# Patient Record
Sex: Female | Born: 1960 | Race: White | Hispanic: No | Marital: Married | Smoking: Never smoker
Health system: Southern US, Community
[De-identification: ages and names within clinical notes are randomized; demographics above are authoritative.]

## PROBLEM LIST (undated history)

## (undated) DIAGNOSIS — I319 Disease of pericardium, unspecified: Secondary | ICD-10-CM

## (undated) DIAGNOSIS — G2581 Restless legs syndrome: Secondary | ICD-10-CM

## (undated) DIAGNOSIS — U071 COVID-19: Secondary | ICD-10-CM

---

## 2005-08-24 DIAGNOSIS — M545 Low back pain, unspecified: Secondary | ICD-10-CM | POA: Insufficient documentation

## 2005-10-11 DIAGNOSIS — G5620 Lesion of ulnar nerve, unspecified upper limb: Secondary | ICD-10-CM | POA: Insufficient documentation

## 2005-11-10 DIAGNOSIS — G5600 Carpal tunnel syndrome, unspecified upper limb: Secondary | ICD-10-CM | POA: Insufficient documentation

## 2006-04-11 DIAGNOSIS — M654 Radial styloid tenosynovitis [de Quervain]: Secondary | ICD-10-CM | POA: Insufficient documentation

## 2017-08-12 DIAGNOSIS — N952 Postmenopausal atrophic vaginitis: Secondary | ICD-10-CM | POA: Insufficient documentation

## 2017-12-13 DIAGNOSIS — R030 Elevated blood-pressure reading, without diagnosis of hypertension: Secondary | ICD-10-CM | POA: Insufficient documentation

## 2017-12-13 DIAGNOSIS — I10 Essential (primary) hypertension: Secondary | ICD-10-CM | POA: Insufficient documentation

## 2017-12-13 DIAGNOSIS — M5126 Other intervertebral disc displacement, lumbar region: Secondary | ICD-10-CM | POA: Insufficient documentation

## 2017-12-13 DIAGNOSIS — M539 Dorsopathy, unspecified: Secondary | ICD-10-CM | POA: Insufficient documentation

## 2017-12-13 DIAGNOSIS — K5900 Constipation, unspecified: Secondary | ICD-10-CM | POA: Insufficient documentation

## 2018-12-03 ENCOUNTER — Encounter (HOSPITAL_COMMUNITY): Payer: Self-pay | Admitting: Emergency Medicine

## 2018-12-03 ENCOUNTER — Ambulatory Visit (HOSPITAL_COMMUNITY)
Admission: EM | Admit: 2018-12-03 | Discharge: 2018-12-03 | Disposition: A | Discharge status: Home / Self Care | Payer: 59 | Attending: Family Medicine | Admitting: Family Medicine

## 2018-12-03 DIAGNOSIS — Z87440 Personal history of urinary (tract) infections: Secondary | ICD-10-CM

## 2018-12-03 DIAGNOSIS — R350 Frequency of micturition: Secondary | ICD-10-CM | POA: Insufficient documentation

## 2018-12-03 DIAGNOSIS — S6390XA Sprain of unspecified part of unspecified wrist and hand, initial encounter: Secondary | ICD-10-CM | POA: Insufficient documentation

## 2018-12-03 DIAGNOSIS — N3289 Other specified disorders of bladder: Secondary | ICD-10-CM | POA: Diagnosis present

## 2018-12-03 DIAGNOSIS — M25579 Pain in unspecified ankle and joints of unspecified foot: Secondary | ICD-10-CM | POA: Insufficient documentation

## 2018-12-03 DIAGNOSIS — M461 Sacroiliitis, not elsewhere classified: Secondary | ICD-10-CM | POA: Insufficient documentation

## 2018-12-03 DIAGNOSIS — S92919A Unspecified fracture of unspecified toe(s), initial encounter for closed fracture: Secondary | ICD-10-CM | POA: Insufficient documentation

## 2018-12-03 DIAGNOSIS — S93409A Sprain of unspecified ligament of unspecified ankle, initial encounter: Secondary | ICD-10-CM | POA: Insufficient documentation

## 2018-12-03 DIAGNOSIS — R300 Dysuria: Secondary | ICD-10-CM | POA: Insufficient documentation

## 2018-12-03 DIAGNOSIS — M25559 Pain in unspecified hip: Secondary | ICD-10-CM | POA: Insufficient documentation

## 2018-12-03 HISTORY — DX: Restless legs syndrome: G25.81

## 2018-12-03 LAB — POCT URINALYSIS DIP (DEVICE)
Glucose, UA: NEGATIVE mg/dL
Hgb urine dipstick: NEGATIVE
KETONES UR: NEGATIVE mg/dL
Leukocytes, UA: NEGATIVE
Nitrite: NEGATIVE
Protein, ur: NEGATIVE mg/dL
Specific Gravity, Urine: >=1.03 (ref 1.005–1.030)
Urobilinogen, UA: 1 mg/dL (ref 0.0–1.0)
pH: 5 (ref 5.0–8.0)

## 2018-12-03 MED ORDER — PHENAZOPYRIDINE HCL 200 MG PO TABS: 200.0000 mg | ORAL_TABLET | Freq: Three times a day (TID) | ORAL | 0 refills | Status: AC

## 2018-12-03 NOTE — ED Triage Notes (Addendum)
Pt c/o dysuria, urinary frequency x5 days. Denies pain at this time. Pt is taking azo

## 2018-12-03 NOTE — ED Provider Notes (Signed)
[facility]   CC: UTI symptoms  SUBJECTIVE:  Carla Horn is a 58 y.o. female who complains of urinary frequency and dysuria x 5 days.  Admits to decreased fluid intake.  Traveling from out of town to see new grandchild.  Complains of associated bladder spasm.  Has tried AZO without relief.  Symptoms are made worse with urination.  Reports hx of frequent UTIs, is followed by gynecologist and has bladder cath performed for bladder spasm.  Denies fever, chills, nausea, vomiting, abdominal pain, flank pain, abnormal vaginal discharge or bleeding, hematuria.    LMP: No LMP recorded.  ROS: As in HPI.  Past Medical History:  Diagnosis Date  . Restless leg    History reviewed. No pertinent surgical history. No Known Allergies No current facility-administered medications on file prior to encounter.    Current Outpatient Medications on File Prior to Encounter  Medication Sig Dispense Refill  . rOPINIRole (REQUIP) 1 MG tablet Requip 1 mg tablet  Take 1 tablet 3 times a day by oral route.     Social History   Socioeconomic History  . Marital status: Married    Spouse name: Not on file  . Number of children: Not on file  . Years of education: Not on file  . Highest education level: Not on file  Occupational History  . Not on file  Social Needs  . Financial resource strain: Not on file  . Food insecurity:    Worry: Not on file    Inability: Not on file  . Transportation needs:    Medical: Not on file    Non-medical: Not on file  Tobacco Use  . Smoking status: Never Smoker  Substance and Sexual Activity  . Alcohol use: Never    Frequency: Never  . Drug use: Not on file  . Sexual activity: Not on file  Lifestyle  . Physical activity:    Days per week: Not on file    Minutes per session: Not on file  . Stress: Not on file  Relationships  . Social connections:    Talks on phone: Not on file    Gets together: Not on file    Attends religious service: Not on  file    Active member of club or organization: Not on file    Attends meetings of clubs or organizations: Not on file    Relationship status: Not on file  . Intimate partner violence:    Fear of current or ex partner: Not on file    Emotionally abused: Not on file    Physically abused: Not on file    Forced sexual activity: Not on file  Other Topics Concern  . Not on file  Social History Narrative  . Not on file   Family History  Problem Relation Age of Onset  . Healthy Sister     OBJECTIVE:  Vitals:   12/03/18 1702  BP: 114/82  Pulse: 85  Resp: 18  Temp: 98.5 F (36.9 C)  SpO2: 100%   General appearance: Alert in no acute distress HEENT: NCAT.  Oropharynx clear.  Lungs: clear to auscultation bilaterally without adventitious breath sounds Heart: regular rate and rhythm.  Radial pulses 2+ symmetrical bilaterally Abdomen: soft; non-distended; no tenderness; bowel sounds present;  no guarding Extremities: no edema; symmetrical with no gross deformities Skin: warm and dry Neurologic: Ambulates from chair to exam table without difficulty Psychological: alert and cooperative; normal mood and affect  Labs Reviewed  POCT URINALYSIS DIP (DEVICE) -  Abnormal; Notable for the following components:      Result Value   Bilirubin Urine SMALL (*)    All other components within normal limits  URINE CULTURE  URINE CYTOLOGY ANCILLARY ONLY    ASSESSMENT & PLAN:  1. Urinary frequency   2. Dysuria   3. Bladder spasm     Meds ordered this encounter  Medications  . phenazopyridine (PYRIDIUM) 200 MG tablet    Sig: Take 1 tablet (200 mg total) by mouth 3 (three) times daily.    Dispense:  6 tablet    Refill:  0    Order Specific Question:   Supervising Provider    Answer:   Eustace MooreELSON, YVONNE SUE [[id number]]    Urine culture sent.  We will call you with abnormal results.   Push fluids and get plenty of rest.   Take pyridium as prescribed and as needed for symptomatic  relief Follow up with PCP if symptoms persists Return here or go to ER if you have any new or worsening symptoms such as fever, worsening abdominal pain, nausea/vomiting, flank pain, etc...  Outlined signs and symptoms indicating need for more acute intervention. Patient verbalized understanding. After Visit Summary given.     Rennis HardingWurst, Shanequia Kendrick, PA-C 12/03/18 1741

## 2018-12-03 NOTE — Discharge Instructions (Signed)
Urine culture sent.  We will call you with abnormal results.   Push fluids and get plenty of rest.   Take pyridium as prescribed and as needed for symptomatic relief Follow up with PCP if symptoms persists Return here or go to ER if you have any new or worsening symptoms such as fever, worsening abdominal pain, nausea/vomiting, flank pain, etc... 

## 2018-12-05 LAB — URINE CULTURE: Culture: 30000 — AB

## 2018-12-05 LAB — URINE CYTOLOGY ANCILLARY ONLY
Bacterial vaginitis: NEGATIVE
Candida vaginitis: NEGATIVE

## 2018-12-07 ENCOUNTER — Telehealth (HOSPITAL_COMMUNITY): Payer: Self-pay | Admitting: Emergency Medicine

## 2018-12-07 MED ORDER — SULFAMETHOXAZOLE-TRIMETHOPRIM 800-160 MG PO TABS: 1.0000 | ORAL_TABLET | Freq: Two times a day (BID) | ORAL | 0 refills | Status: AC

## 2018-12-07 MED ORDER — SULFAMETHOXAZOLE-TRIMETHOPRIM 800-160 MG PO TABS: 1.0000 | ORAL_TABLET | Freq: Two times a day (BID) | ORAL | 0 refills | Status: DC

## 2018-12-07 NOTE — Telephone Encounter (Signed)
Urine culture was positive for E coli. Prescription for bactrim per protocol sent to pharmacy of choice. Attempted to reach patient. No answer at this time. Voicemail left.

## 2018-12-07 NOTE — Telephone Encounter (Signed)
Pt called back given results, will sent medicine to pharmacy in [place].

## 2020-10-04 ENCOUNTER — Other Ambulatory Visit: Payer: Self-pay

## 2020-10-04 ENCOUNTER — Emergency Department
Admission: EM | Admit: 2020-10-04 | Discharge: 2020-10-04 | Disposition: A | Discharge status: Home / Self Care | Payer: 59 | Attending: Emergency Medicine | Admitting: Emergency Medicine

## 2020-10-04 ENCOUNTER — Encounter: Payer: Self-pay | Admitting: Emergency Medicine

## 2020-10-04 ENCOUNTER — Emergency Department: Payer: 59

## 2020-10-04 DIAGNOSIS — R0602 Shortness of breath: Secondary | ICD-10-CM | POA: Insufficient documentation

## 2020-10-04 DIAGNOSIS — R0789 Other chest pain: Secondary | ICD-10-CM | POA: Diagnosis present

## 2020-10-04 DIAGNOSIS — Z5321 Procedure and treatment not carried out due to patient leaving prior to being seen by health care provider: Secondary | ICD-10-CM | POA: Insufficient documentation

## 2020-10-04 HISTORY — DX: COVID-19: U07.1

## 2020-10-04 HISTORY — DX: Disease of pericardium, unspecified: I31.9

## 2020-10-04 LAB — CBC
HCT: 43.3 % (ref 36.0–46.0)
Hemoglobin: 14.1 g/dL (ref 12.0–15.0)
MCH: 29.8 pg (ref 26.0–34.0)
MCHC: 32.6 g/dL (ref 30.0–36.0)
MCV: 91.5 fL (ref 80.0–100.0)
Platelets: 321 10*3/uL (ref 150–400)
RBC: 4.73 MIL/uL (ref 3.87–5.11)
RDW: 13.3 % (ref 11.5–15.5)
WBC: 8.8 10*3/uL (ref 4.0–10.5)
nRBC: 0 % (ref 0.0–0.2)

## 2020-10-04 LAB — BASIC METABOLIC PANEL
Anion gap: 8 (ref 5–15)
BUN: 14 mg/dL (ref 6–20)
CO2: 24 mmol/L (ref 22–32)
Calcium: 8.7 mg/dL — ABNORMAL LOW (ref 8.9–10.3)
Chloride: 106 mmol/L (ref 98–111)
Creatinine, Ser: 0.86 mg/dL (ref 0.44–1.00)
GFR, Estimated: >60 mL/min (ref >60)
Glucose, Bld: 115 mg/dL — ABNORMAL HIGH (ref 70–99)
Potassium: 4.2 mmol/L (ref 3.5–5.1)
Sodium: 138 mmol/L (ref 135–145)

## 2020-10-04 LAB — TROPONIN I (HIGH SENSITIVITY): Troponin I (High Sensitivity): 3 ng/L (ref <18)

## 2020-10-04 NOTE — ED Notes (Signed)
Patient to ED via POV from home with complaints of chest tightness and shortness of breath. Was at a playground when this episode occurred but was at rest. Husband states these episodes have been happening for about six months off and on.

## 2020-10-04 NOTE — ED Triage Notes (Signed)
Pt arrived via POV with reports of CP that started 30 minutes prior to arrival, pt states she has hx of similar CP when she had COVID x 2 and was dx with pericarditis both times.  Pt states she feels like there is "drainage" in her lungs and states the pain goes up to her ears.  No respiratory distress noted at this time. Pt does cough and is productive at times.  Denies any fevers.

## 2022-02-15 ENCOUNTER — Ambulatory Visit
Admission: EM | Admit: 2022-02-15 | Discharge: 2022-02-15 | Disposition: A | Discharge status: Home / Self Care | Payer: 59

## 2022-02-15 ENCOUNTER — Encounter: Payer: Self-pay | Admitting: Emergency Medicine

## 2022-02-15 DIAGNOSIS — R0981 Nasal congestion: Secondary | ICD-10-CM | POA: Diagnosis not present

## 2022-02-15 DIAGNOSIS — Z23 Encounter for immunization: Secondary | ICD-10-CM | POA: Diagnosis not present

## 2022-02-15 DIAGNOSIS — J302 Other seasonal allergic rhinitis: Secondary | ICD-10-CM | POA: Diagnosis not present

## 2022-02-15 DIAGNOSIS — S61215A Laceration without foreign body of left ring finger without damage to nail, initial encounter: Secondary | ICD-10-CM

## 2022-02-15 MED ORDER — TETANUS-DIPHTH-ACELL PERTUSSIS 5-2.5-18.5 LF-MCG/0.5 IM SUSY
0.5000 mL | PREFILLED_SYRINGE | Freq: Once | INTRAMUSCULAR | Status: AC
  Administered 2022-02-15: 0.5 mL via INTRAMUSCULAR

## 2022-02-15 NOTE — ED Triage Notes (Addendum)
Pt presents with sinus pressure, runny nose, itchy ears and ST x 2 days. Pt also has a laceration to her left ring finger yesterday on a fence.  ?

## 2022-02-15 NOTE — Discharge Instructions (Addendum)
Take Mucinex, Flonase nasal spray, and Zyrtec as discussed.   ? ?Your tetanus was updated today.  See the attached information on laceration care.   ? ? ?

## 2022-02-15 NOTE — ED Provider Notes (Signed)
?[facility] ? ? ? ?CSN: [id number] ?Arrival date & time: 02/15/22  1116 ? ? ?  ? ?History   ?Chief Complaint ?Chief Complaint  ?Patient presents with  ? Sinus Pressure  ? Cough  ? Sore Throat  ? Nasal Congestion  ? Laceration  ? ? ?HPI ?Carla Horn is a 61 y.o. female.  Patient presents with 2-day history of runny nose, postnasal drip, sinus congestion, itching ears, sore throat, mild occasional cough.  Patient also has a laceration on her left ring finger that occurred yesterday evening.  The laceration started bleeding again today when her Band-Aid came off.  She is not on anticoagulants.  No fever, rash, shortness of breath, vomiting, diarrhea, or other symptoms.  Her medical history includes hypertension, pericarditis, restless leg syndrome, carpal tunnel syndrome.  Last tetanus unknown.  ? ?The history is provided by the patient and medical records.  ? ?Past Medical History:  ?Diagnosis Date  ? COVID-19   ? 07/2019 and 12/2019  ? Pericarditis   ? Restless leg   ? ? ?Patient Active Problem List  ? Diagnosis Date Noted  ? Sprain of hand 12/03/2018  ? Sprain of ankle 12/03/2018  ? 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 Hip pain 12/03/2018  ? Fracture of phalanx of foot 12/03/2018  ? Pain in joint involving ankle and foot 12/03/2018  ? Essential hypertension 12/13/2017  ? Elevated BP without diagnosis of hypertension 12/13/2017  ? Constipation 12/13/2017  ? Back problem 12/13/2017  ? Displacement of lumbar intervertebral disc without myelopathy 12/13/2017  ? Atrophic vaginitis 08/12/2017  ? Radial styloid tenosynovitis 04/11/2006  ? Carpal tunnel syndrome 11/10/2005  ? Lesion of ulnar nerve 10/11/2005  ? Low back pain 08/24/2005  ? ? ?History reviewed. No pertinent surgical history. ? ?OB History   ?No obstetric history on file. ?  ? ? ? ?Home Medications   ? ?Prior to Admission medications   ?Medication Sig Start Date End Date Taking? Authorizing Provider  ?DULoxetine (CYMBALTA) 60 MG  capsule Take 60 mg by mouth daily. 01/22/22   [provider]  ?phenazopyridine (PYRIDIUM) 200 MG tablet Take 1 tablet (200 mg total) by mouth 3 (three) times daily. 12/03/18   Wurst, [place], PA-C  ?pramipexole (MIRAPEX) 0.75 MG tablet Take 0.75 mg by mouth 3 (three) times daily. 01/12/22   [provider]  ? ? ?Family History ?Family History  ?Problem Relation Age of Onset  ? Healthy Sister   ? ? ?Social History ?Social History  ? ?Tobacco Use  ? Smoking status: Never  ? Smokeless tobacco: Never  ?Vaping Use  ? Vaping Use: Never used  ?Substance Use Topics  ? Alcohol use: Never  ? ? ? ?Allergies   ?Codeine ? ? ?Review of Systems ?Review of Systems  ?Constitutional:  Negative for chills and fever.  ?HENT:  Positive for congestion, postnasal drip, rhinorrhea and sore throat. Negative for ear pain.   ?Eyes:  Negative for visual disturbance.  ?Respiratory:  Positive for cough. Negative for shortness of breath.   ?Cardiovascular:  Negative for chest pain and palpitations.  ?Gastrointestinal:  Negative for diarrhea and vomiting.  ?Skin:  Positive for wound. Negative for color change.  ?All other systems reviewed and are negative. ? ? ?Physical Exam ?Triage Vital Signs ?ED Triage Vitals  ?Enc Vitals Group  ?   BP 02/15/22 1127 133/84  ?   Pulse Rate 02/15/22 1127 81  ?   Resp 02/15/22 1127 18  ?  Temp 02/15/22 1127 98.4 ?F (36.9 ?C)  ?   Temp src --   ?   SpO2 02/15/22 1127 98 %  ?   Weight --   ?   Height --   ?   Head Circumference --   ?   Peak Flow --   ?   Pain Score 02/15/22 1129 0  ?   Pain Loc --   ?   Pain Edu? --   ?   Excl. in [place]? --   ? ?No data found. ? ?Updated Vital Signs ?BP 133/84   Pulse 81   Temp 98.4 ?F (36.9 ?C)   Resp 18   SpO2 98%  ? ?Visual Acuity ?Right Eye Distance:   ?Left Eye Distance:   ?Bilateral Distance:   ? ?Right Eye Near:   ?Left Eye Near:    ?Bilateral Near:    ? ?Physical Exam ?Vitals and nursing note reviewed.  ?Constitutional:   ?   General: She is not in acute  distress. ?   Appearance: Normal appearance. She is well-developed. She is not ill-appearing.  ?HENT:  ?   Right Ear: Tympanic membrane normal.  ?   Left Ear: Tympanic membrane normal.  ?   Nose: Rhinorrhea present.  ?   Mouth/Throat:  ?   Mouth: Mucous membranes are moist.  ?   Pharynx: Oropharynx is clear.  ?Eyes:  ?   Conjunctiva/sclera: Conjunctivae normal.  ?Cardiovascular:  ?   Rate and Rhythm: Normal rate and regular rhythm.  ?   Heart sounds: Normal heart sounds.  ?Pulmonary:  ?   Effort: Pulmonary effort is normal. No respiratory distress.  ?   Breath sounds: Normal breath sounds.  ?Musculoskeletal:     ?   General: No swelling. Normal range of motion.  ?     Hands: ? ?   Cervical back: Neck supple.  ?Skin: ?   General: Skin is warm and dry.  ?   Capillary Refill: Capillary refill takes less than 2 seconds.  ?   Findings: Lesion present. No erythema.  ?   Comments: 1 cm laceration on palmar side of left ring finger.  See diagram.   ?Neurological:  ?   General: No focal deficit present.  ?   Mental Status: She is alert and oriented to person, place, and time.  ?   Sensory: No sensory deficit.  ?   Motor: No weakness.  ?Psychiatric:     ?   Mood and Affect: Mood normal.     ?   Behavior: Behavior normal.  ? ? ? ?UC Treatments / Results  ?Labs ?(all labs ordered are listed, but only abnormal results are displayed) ?Labs Reviewed  ?COVID-19, FLU A+B AND RSV  ? ? ?EKG ? ? ?Radiology ?No results found. ? ?Procedures ?Laceration Repair ? ?Date/Time: 02/15/2022 12:22 PM ?Performed by: Sharion Balloon, NP ?Authorized by: Sharion Balloon, NP  ? ?Consent:  ?  Consent obtained:  Verbal ?  Consent given by:  Patient ?  Risks discussed:  Infection, pain and poor wound healing ?Universal protocol:  ?  Procedure explained and questions answered to patient or proxy's satisfaction: yes   ?Anesthesia:  ?  Anesthesia method:  None ?Laceration details:  ?  Location:  Finger ?  Finger location:  L ring finger ?  Length (cm):  1 ?   Depth (mm):  1 ?Pre-procedure details:  ?  Preparation:  Patient was prepped and draped in usual sterile  fashion ?Exploration:  ?  Hemostasis achieved with:  Direct pressure ?  Wound exploration: wound explored through full range of motion and entire depth of wound visualized   ?Treatment:  ?  Area cleansed with:  Shur-Clens ?  Amount of cleaning:  Standard ?  Irrigation solution:  Sterile water ?  Irrigation method:  Syringe ?  Visualized foreign bodies/material removed: no   ?Skin repair:  ?  Repair method:  Tissue adhesive ?Approximation:  ?  Approximation:  Close ?Repair type:  ?  Repair type:  Simple ?Post-procedure details:  ?  Dressing:  Open (no dressing) ?  Procedure completion:  Tolerated well, no immediate complications (including critical care time) ? ?Medications Ordered in UC ?Medications  ?Tdap (BOOSTRIX) injection 0.5 mL (0.5 mLs Intramuscular Given 02/15/22 1203)  ? ? ?Initial Impression / Assessment and Plan / UC Course  ?I have reviewed the triage vital signs and the nursing notes. ? ?Pertinent labs & imaging results that were available during my care of the patient were reviewed by me and considered in my medical decision making (see chart for details). ? ?Seasonal allergies, nasal congestion.  Laceration of left ring finger.  Patient is here visiting family in [place] for the arrival of her baby grandson tomorrow.  Discussed symptomatic care of allergies, including Mucinex, Flonase, Zyrtec.  Laceration closed with Dermabond.  Tetanus updated today.  Wound care instructions and signs of infection discussed.  Instructed patient to follow-up with her PCP as needed.  She agrees to plan of care. ? ? ?Final Clinical Impressions(s) / UC Diagnoses  ? ?Final diagnoses:  ?Seasonal allergies  ?Nasal congestion  ?Laceration of left ring finger without foreign body without damage to nail, initial encounter  ? ? ? ?Discharge Instructions   ? ?  ?Take Mucinex, Flonase nasal spray, and Zyrtec as  discussed.   ? ?Your tetanus was updated today.  See the attached information on laceration care.   ? ? ? ? ? ? ?ED Prescriptions   ?None ?  ? ?PDMP not reviewed this encounter. ?  ?Sharion Balloon, NP ?02/15/22 1223 ? ?

## 2022-02-17 LAB — COVID-19, FLU A+B AND RSV
Influenza A, NAA: NOT DETECTED
Influenza B, NAA: NOT DETECTED
RSV, NAA: DETECTED — AB
SARS-CoV-2, NAA: NOT DETECTED

## 2022-02-27 IMAGING — CR DG CHEST 2V
1 series · 2 of 2 positions shown · non-contrast
Comparison: None.

CLINICAL DATA: Chest pain which began 30 minutes prior to
admission, prior [id number] X 2, history of pericarditis

EXAM:
CHEST - 2 VIEW

[Series 1: dg chest 2 view · 0.14mm/px · 2 of 2 slices shown]
[im 1/2]
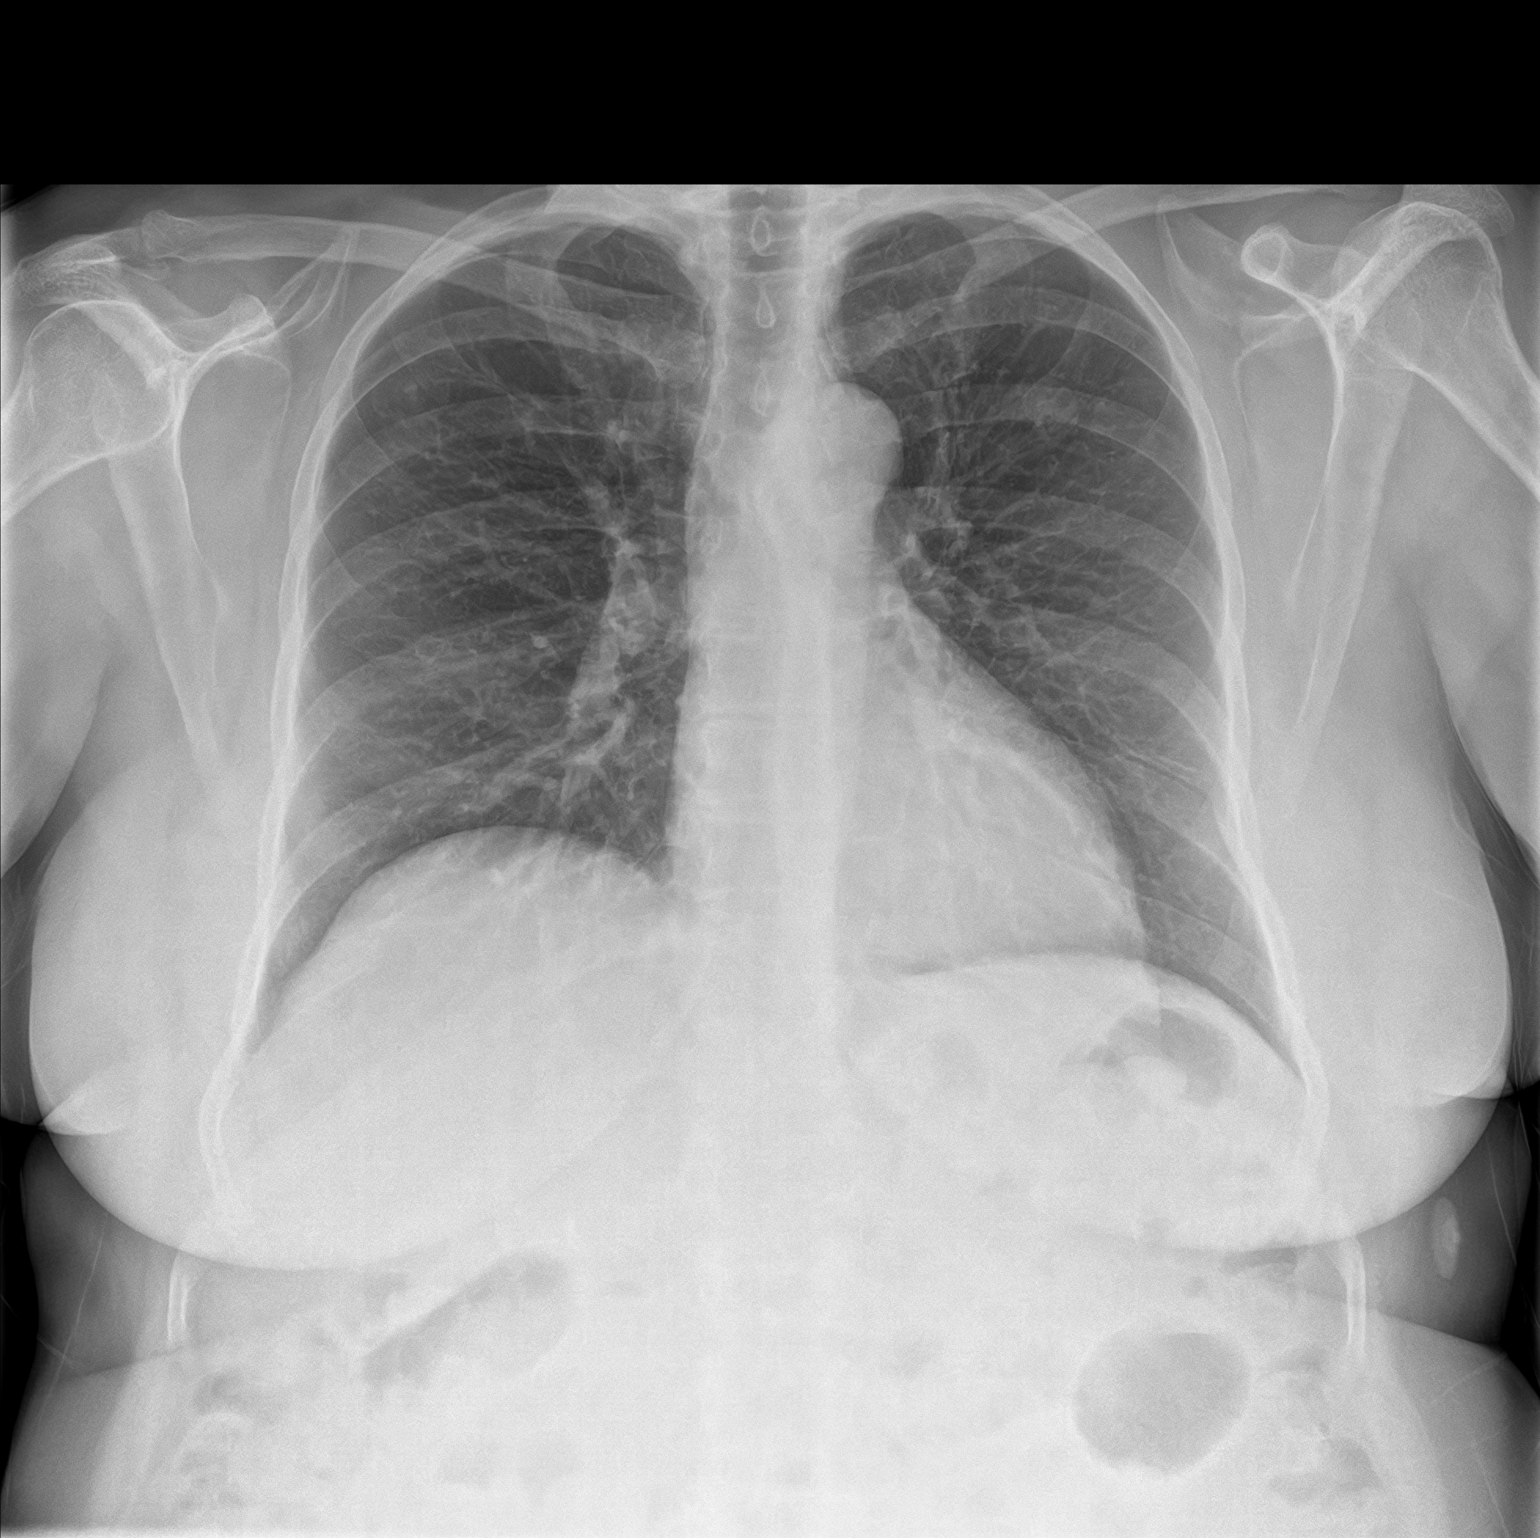
[im 2/2]
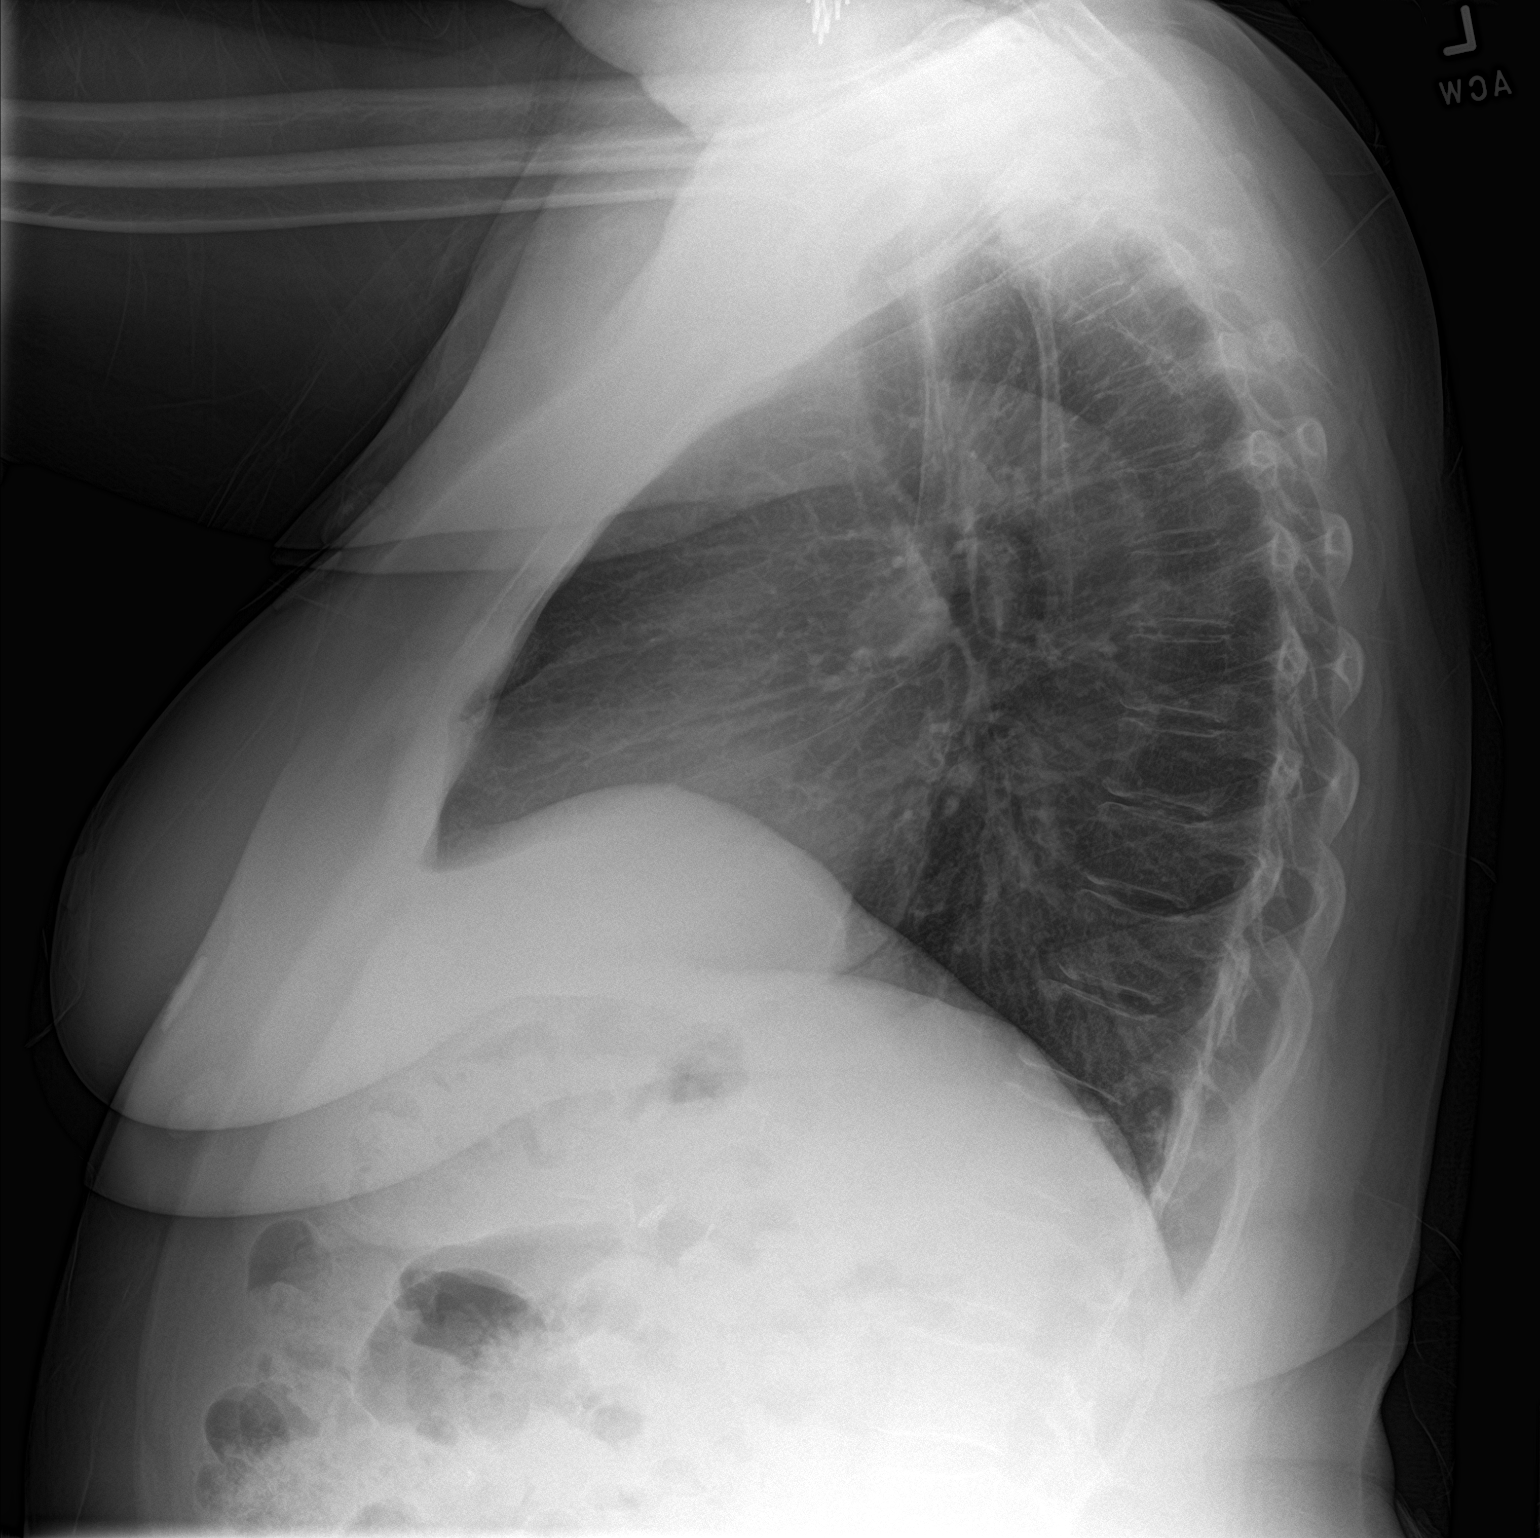

[2 of 2 positions shown; findings below may reference images not displayed]

FINDINGS: No consolidation, features of edema, pneumothorax, or effusion.
Pulmonary vascularity is normally distributed. The cardiomediastinal
contours are unremarkable. No acute osseous or soft tissue
abnormality.
IMPRESSION: No acute cardiopulmonary abnormality.

## 2023-12-01 ENCOUNTER — Ambulatory Visit
Admission: RE | Admit: 2023-12-01 | Discharge: 2023-12-01 | Disposition: A | Discharge status: Home / Self Care | Payer: 59 | Source: Ambulatory Visit

## 2023-12-01 VITALS — BP 121/84 | HR 102 | Temp 100.3°F | Resp 16

## 2023-12-01 DIAGNOSIS — J101 Influenza due to other identified influenza virus with other respiratory manifestations: Secondary | ICD-10-CM

## 2023-12-01 DIAGNOSIS — R509 Fever, unspecified: Secondary | ICD-10-CM | POA: Diagnosis not present

## 2023-12-01 DIAGNOSIS — R000 Tachycardia, unspecified: Secondary | ICD-10-CM | POA: Diagnosis not present

## 2023-12-01 LAB — POCT INFLUENZA A/B
Influenza A, POC: POSITIVE — AB
Influenza B, POC: NEGATIVE

## 2023-12-01 MED ORDER — OSELTAMIVIR PHOSPHATE 75 MG PO CAPS: 75.0000 mg | ORAL_CAPSULE | Freq: Two times a day (BID) | ORAL | 0 refills | Status: AC

## 2023-12-01 MED ORDER — PROMETHAZINE-DM 6.25-15 MG/5ML PO SYRP: 5.0000 mL | ORAL_SOLUTION | Freq: Four times a day (QID) | ORAL | 0 refills | Status: AC | PRN

## 2023-12-01 MED ORDER — FLUTICASONE PROPIONATE 50 MCG/ACT NA SUSP: 1.0000 | Freq: Two times a day (BID) | NASAL | 2 refills | Status: AC

## 2023-12-01 NOTE — ED Triage Notes (Signed)
Pt reports she has a cough, fever, runny nose, body aches, upset stomach and headache x 2 days      Took Excedrin and tylenol

## 2023-12-01 NOTE — ED Provider Notes (Signed)
[facility]    CSN: [id number] Arrival date & time: 12/01/23  1650      History   Chief Complaint Chief Complaint  Patient presents with   Fever    Flu like symptoms - Entered by patient    HPI Carla Horn is a 63 y.o. female.   Presenting today with 2-day history of cough, fever, chills, body aches, runny nose, abdominal pain, headache.  Denies chest pain, shortness of breath, vomiting, diarrhea.  So far trying NyQuil, NyQuil, Tylenol, Excedrin with mild temporary benefit.  Multiple sick contacts with the flu recently.    Past Medical History:  Diagnosis Date   COVID-19    07/2019 and 12/2019   Pericarditis    Restless leg     Patient Active Problem List   Diagnosis Date Noted   Sprain of hand 12/03/2018   Sprain of ankle 12/03/2018   Inflammation of sacroiliac joint ([facility]) 12/03/2018   Hip pain 12/03/2018   Fracture of phalanx of foot 12/03/2018   Pain in joint involving ankle and foot 12/03/2018   Essential hypertension 12/13/2017   Elevated BP without diagnosis of hypertension 12/13/2017   Constipation 12/13/2017   Back problem 12/13/2017   Displacement of lumbar intervertebral disc without myelopathy 12/13/2017   Atrophic vaginitis 08/12/2017   Radial styloid tenosynovitis 04/11/2006   Carpal tunnel syndrome 11/10/2005   Lesion of ulnar nerve 10/11/2005   Low back pain 08/24/2005    History reviewed. No pertinent surgical history.  OB History   No obstetric history on file.      Home Medications    Prior to Admission medications   Medication Sig Start Date End Date Taking? Authorizing Provider  fluticasone (FLONASE) 50 MCG/ACT nasal spray Place 1 spray into both nostrils 2 (two) times daily. 12/01/23  Yes Particia Nearing, PA-C  oseltamivir (TAMIFLU) 75 MG capsule Take 1 capsule (75 mg total) by mouth every 12 (twelve) hours. 12/01/23  Yes Particia Nearing, PA-C  promethazine-dextromethorphan (PROMETHAZINE-DM) 6.25-15 MG/5ML  syrup Take 5 mLs by mouth 4 (four) times daily as needed. 12/01/23  Yes Particia Nearing, PA-C  DULoxetine (CYMBALTA) 60 MG capsule Take 60 mg by mouth daily. 01/22/22   [provider]  HYDROcodone-acetaminophen (NORCO) 7.5-325 MG tablet Norco 7.5 mg-325 mg tablet  Take 1-2 tablets every 4-6 hours PRN for pain    [provider]  phenazopyridine (PYRIDIUM) 200 MG tablet Take 1 tablet (200 mg total) by mouth 3 (three) times daily. 12/03/18   Wurst, [place], PA-C  pramipexole (MIRAPEX) 0.75 MG tablet Take 0.75 mg by mouth 3 (three) times daily. 01/12/22   [provider]    Family History Family History  Problem Relation Age of Onset   Healthy Sister     Social History Social History   Tobacco Use   Smoking status: Never   Smokeless tobacco: Never  Vaping Use   Vaping status: Never Used  Substance Use Topics   Alcohol use: Never     Allergies   Codeine   Review of Systems Review of Systems Per HPI  Physical Exam Triage Vital Signs ED Triage Vitals  Encounter Vitals Group     BP 12/01/23 1700 121/84     Systolic BP Percentile --      Diastolic BP Percentile --      Pulse Rate 12/01/23 1700 (!) 102     Resp 12/01/23 1700 16     Temp 12/01/23 1700 100.3 F (37.9 C)  Temp Source 12/01/23 1700 Oral     SpO2 12/01/23 1700 93 %     Weight --      Height --      Head Circumference --      Peak Flow --      Pain Score 12/01/23 1701 7     Pain Loc --      Pain Education --      Exclude from Growth Chart --    No data found.  Updated Vital Signs BP 121/84 (BP Location: Right Arm)   Pulse (!) 102   Temp 100.3 F (37.9 C) (Oral)   Resp 16   SpO2 93%   Visual Acuity Right Eye Distance:   Left Eye Distance:   Bilateral Distance:    Right Eye Near:   Left Eye Near:    Bilateral Near:     Physical Exam Vitals and nursing note reviewed.  Constitutional:      Appearance: Normal appearance.  HENT:     Head: Atraumatic.      Right Ear: Tympanic membrane and external ear normal.     Left Ear: Tympanic membrane and external ear normal.     Nose: Rhinorrhea present.     Mouth/Throat:     Mouth: Mucous membranes are moist.     Pharynx: Posterior oropharyngeal erythema present.  Eyes:     Extraocular Movements: Extraocular movements intact.     Conjunctiva/sclera: Conjunctivae normal.  Cardiovascular:     Rate and Rhythm: Normal rate and regular rhythm.     Heart sounds: Normal heart sounds.  Pulmonary:     Effort: Pulmonary effort is normal.     Breath sounds: Normal breath sounds. No wheezing.  Musculoskeletal:        General: Normal range of motion.     Cervical back: Normal range of motion and neck supple.  Skin:    General: Skin is warm and dry.  Neurological:     Mental Status: She is alert and oriented to person, place, and time.  Psychiatric:        Mood and Affect: Mood normal.        Thought Content: Thought content normal.      UC Treatments / Results  Labs (all labs ordered are listed, but only abnormal results are displayed) Labs Reviewed  POCT INFLUENZA A/B - Abnormal; Notable for the following components:      Result Value   Influenza A, POC Positive (*)    All other components within normal limits    EKG   Radiology No results found.  Procedures Procedures (including critical care time)  Medications Ordered in UC Medications - No data to display  Initial Impression / Assessment and Plan / UC Course  I have reviewed the triage vital signs and the nursing notes.  Pertinent labs & imaging results that were available during my care of the patient were reviewed by me and considered in my medical decision making (see chart for details).     Febrile and tachycardic in triage, otherwise vital signs reassuring.  Rapid flu positive for influenza A.  Treat with Tamiflu, Phenergan DM, Flonase, supportive over-the-counter medications and home care.  Return for worsening  symptoms.  Final Clinical Impressions(s) / UC Diagnoses   Final diagnoses:  Influenza A  Fever, unspecified  Tachycardia   Discharge Instructions   None    ED Prescriptions     Medication Sig Dispense Auth. Provider   oseltamivir (TAMIFLU) 75 MG capsule  Take 1 capsule (75 mg total) by mouth every 12 (twelve) hours. 10 capsule Particia Nearing, [place]   promethazine-dextromethorphan (PROMETHAZINE-DM) 6.25-15 MG/5ML syrup Take 5 mLs by mouth 4 (four) times daily as needed. 100 mL Particia Nearing, PA-C   fluticasone [facility]) 50 MCG/ACT nasal spray Place 1 spray into both nostrils 2 (two) times daily. 16 g Particia Nearing, [place]      PDMP not reviewed this encounter.   Particia Nearing, [place] 12/01/23 1733

## 2024-02-26 ENCOUNTER — Other Ambulatory Visit: Payer: Self-pay | Admitting: Family Medicine
